# Patient Record
Sex: Female | Born: 1985 | Race: White | Hispanic: No | Marital: Single | Smoking: Never smoker
Health system: Southern US, Community
[De-identification: ages and names within clinical notes are randomized; demographics above are authoritative.]

## PROBLEM LIST (undated history)

## (undated) DIAGNOSIS — M25569 Pain in unspecified knee: Secondary | ICD-10-CM

## (undated) DIAGNOSIS — J45990 Exercise induced bronchospasm: Secondary | ICD-10-CM

## (undated) DIAGNOSIS — Z8719 Personal history of other diseases of the digestive system: Secondary | ICD-10-CM

## (undated) DIAGNOSIS — E282 Polycystic ovarian syndrome: Secondary | ICD-10-CM

## (undated) DIAGNOSIS — E88819 Insulin resistance, unspecified: Secondary | ICD-10-CM

## (undated) DIAGNOSIS — K760 Fatty (change of) liver, not elsewhere classified: Secondary | ICD-10-CM

## (undated) DIAGNOSIS — E8881 Metabolic syndrome: Secondary | ICD-10-CM

## (undated) HISTORY — DX: Pain in unspecified knee: M25.569

## (undated) HISTORY — PX: TYMPANOSTOMY TUBE PLACEMENT: SHX32

## (undated) HISTORY — DX: Metabolic syndrome: E88.81

## (undated) HISTORY — DX: Polycystic ovarian syndrome: E28.2

## (undated) HISTORY — DX: Personal history of other diseases of the digestive system: Z87.19

## (undated) HISTORY — DX: Exercise induced bronchospasm: J45.990

## (undated) HISTORY — DX: Fatty (change of) liver, not elsewhere classified: K76.0

## (undated) HISTORY — PX: TONSILLECTOMY: SUR1361

## (undated) HISTORY — DX: Insulin resistance, unspecified: E88.819

---

## 2010-01-14 ENCOUNTER — Encounter: Admission: RE | Admit: 2010-01-14 | Discharge: 2010-03-05 | Payer: Self-pay | Admitting: Family Medicine

## 2010-04-01 ENCOUNTER — Ambulatory Visit (HOSPITAL_COMMUNITY): Admission: RE | Admit: 2010-04-01 | Discharge: 2010-04-01 | Payer: Self-pay | Admitting: General Surgery

## 2010-04-13 ENCOUNTER — Ambulatory Visit (HOSPITAL_COMMUNITY): Admission: RE | Admit: 2010-04-13 | Discharge: 2010-04-13 | Payer: Self-pay | Admitting: General Surgery

## 2010-05-27 ENCOUNTER — Encounter
Admission: RE | Admit: 2010-05-27 | Discharge: 2010-08-04 | Payer: Self-pay | Source: Home / Self Care | Attending: General Surgery | Admitting: General Surgery

## 2010-06-09 ENCOUNTER — Ambulatory Visit (HOSPITAL_COMMUNITY)
Admission: RE | Admit: 2010-06-09 | Discharge: 2010-06-09 | Payer: Self-pay | Source: Home / Self Care | Admitting: General Surgery

## 2010-09-15 LAB — COMPREHENSIVE METABOLIC PANEL
ALT: 148 U/L — ABNORMAL HIGH (ref 0–35)
AST: 190 U/L — ABNORMAL HIGH (ref 0–37)
Albumin: 4.1 g/dL (ref 3.5–5.2)
Calcium: 9.6 mg/dL (ref 8.4–10.5)
GFR calc Af Amer: >60 mL/min (ref >60)
Glucose, Bld: 102 mg/dL — ABNORMAL HIGH (ref 70–99)
Sodium: 138 mEq/L (ref 135–145)
Total Protein: 7.7 g/dL (ref 6.0–8.3)

## 2010-09-15 LAB — PREGNANCY, URINE: Preg Test, Ur: NEGATIVE

## 2010-09-15 LAB — CBC
MCHC: 34.8 g/dL (ref 30.0–36.0)
Platelets: 200 10*3/uL (ref 150–400)
RDW: 13.6 % (ref 11.5–15.5)
WBC: 7.1 10*3/uL (ref 4.0–10.5)

## 2010-09-15 LAB — DIFFERENTIAL
Eosinophils Absolute: 0.2 10*3/uL (ref 0.0–0.7)
Lymphs Abs: 2.3 10*3/uL (ref 0.7–4.0)
Monocytes Absolute: 0.6 10*3/uL (ref 0.1–1.0)
Monocytes Relative: 9 % (ref 3–12)
Neutrophils Relative %: 55 % (ref 43–77)

## 2010-09-15 LAB — SURGICAL PCR SCREEN: MRSA, PCR: NEGATIVE

## 2010-09-22 ENCOUNTER — Ambulatory Visit: Payer: Self-pay | Admitting: *Deleted

## 2010-10-01 ENCOUNTER — Encounter: Payer: BC Managed Care – PPO | Attending: General Surgery | Admitting: *Deleted

## 2010-10-01 ENCOUNTER — Encounter: Payer: BC Managed Care – PPO | Admitting: *Deleted

## 2010-10-01 DIAGNOSIS — Z9884 Bariatric surgery status: Secondary | ICD-10-CM | POA: Insufficient documentation

## 2010-10-01 DIAGNOSIS — Z09 Encounter for follow-up examination after completed treatment for conditions other than malignant neoplasm: Secondary | ICD-10-CM | POA: Insufficient documentation

## 2010-10-01 DIAGNOSIS — Z713 Dietary counseling and surveillance: Secondary | ICD-10-CM | POA: Insufficient documentation

## 2010-10-15 ENCOUNTER — Ambulatory Visit: Payer: BC Managed Care – PPO | Admitting: *Deleted

## 2010-12-15 ENCOUNTER — Encounter (INDEPENDENT_AMBULATORY_CARE_PROVIDER_SITE_OTHER): Payer: Self-pay | Admitting: General Surgery

## 2010-12-28 ENCOUNTER — Ambulatory Visit: Payer: BC Managed Care – PPO | Admitting: *Deleted

## 2011-01-21 ENCOUNTER — Encounter: Payer: Self-pay | Admitting: *Deleted

## 2011-01-21 ENCOUNTER — Encounter: Payer: BC Managed Care – PPO | Attending: General Surgery | Admitting: *Deleted

## 2011-01-21 VITALS — Ht 70.0 in | Wt 239.4 lb

## 2011-01-21 DIAGNOSIS — Z713 Dietary counseling and surveillance: Secondary | ICD-10-CM | POA: Insufficient documentation

## 2011-01-21 DIAGNOSIS — Z09 Encounter for follow-up examination after completed treatment for conditions other than malignant neoplasm: Secondary | ICD-10-CM | POA: Insufficient documentation

## 2011-01-21 DIAGNOSIS — Z9884 Bariatric surgery status: Secondary | ICD-10-CM | POA: Insufficient documentation

## 2011-01-21 NOTE — Progress Notes (Signed)
  Follow-up visit: 7 Months Post-Operative LAGB Surgery  Medical Nutrition Therapy:  Appt start time: 1230 end time:  1300.  Assessment:  Primary concerns today: post-operative bariatric surgery nutrition management.  Weight today: 239.2 lbs Weight change: 8.2 lbs Total weight lost: 59 lbs total BMI: 34.4% Weight goal: 180 lbs % Weight goal met: 50%  Dietary intake: Pt unable to give food recall at time of visit. She reports making several "bad choices" and notes that "I have been eating out too much". Pt is not following a meal schedule with school being out and feels that once she begins her routine again she will be able to "jump start my weight loss". She struggles with making low-carb choices but also mentions that starchy foods "swell" in her stomach and cause n/v. AT notes that she can eat about 1 to 1 1/2 cups of food at one time.  Fluid intake: water, sugar free beverages = 32-48 oz Estimated total protein intake: 60-75g  Medications: Pt reports no changes to meds Supplementation: 75% compliance with ASBMS recommendations  Using straws: No Drinking while eating: No Hair loss: No Carbonated beverages: No N/V/D/C: Occasionally related to poor chewing, dry food (2-3 times/week) *Pt feels like her band is tight but she notes that she eats too fast, occasionally eats a lot of carb, and forgets to chew well  Last Lap-Band fill: No recent fills this past month. To see Mardelle Matte tomorrow.  Recent physical activity:  Pt reports decreased activity levels due to being on vacation. She reports playing some tournament sports and plans to increase her exercise routine. Pt notes an ankle injury >1 month ago which has "set me back".  Progress Towards Goal(s):  In progress.   Nutritional Diagnosis:  -3.3 Overweight/obesity As related to recent LAGB surgery.  As evidenced by pt continuing to follow LAGB dietary guidelines.    Intervention:    Follow Phase 3B: High Protein + Non-Starchy  Vegetables  Eat 3-6 small meals/snacks, every 3-5 hrs  Increase lean protein foods to meet 80-100g goal  Increase fluid intake to 64oz +  Add 15 grams of carbohydrate (fruit, whole grain, starchy vegetable) with meals  Avoid drinking 15 minutes before, during and 30 minutes after eating  Aim for >30 min of physical activity daily  Monitoring/Evaluation:  Dietary intake, exercise, lap band fills, and body weight. Follow up in 3 months for 10 month post-op visit.

## 2011-01-21 NOTE — Progress Notes (Signed)
Pt to see Dr. Andrey Campanile for band fill tomorrow. Note pt will be leaving next week for a week long trip to [place], [place] where she will be participating in a women's football championship.

## 2011-01-21 NOTE — Patient Instructions (Signed)
Goals:  Follow Phase 3B: High Protein + Non-Starchy Vegetables  Eat 3-6 small meals/snacks, every 3-5 hrs  Increase lean protein foods to meet 80-100g goal  Increase fluid intake to 64oz +  Add 15 grams of carbohydrate (fruit, whole grain, starchy vegetable) with meals  Avoid drinking 15 minutes before, during and 30 minutes after eating  Aim for >30 min of physical activity daily 

## 2011-01-22 ENCOUNTER — Encounter (INDEPENDENT_AMBULATORY_CARE_PROVIDER_SITE_OTHER): Payer: Self-pay | Admitting: General Surgery

## 2011-01-22 ENCOUNTER — Ambulatory Visit (INDEPENDENT_AMBULATORY_CARE_PROVIDER_SITE_OTHER): Payer: BC Managed Care – PPO | Admitting: General Surgery

## 2011-01-22 VITALS — BP 112/78 | Ht 70.0 in | Wt 239.8 lb

## 2011-01-22 DIAGNOSIS — Z9884 Bariatric surgery status: Secondary | ICD-10-CM

## 2011-01-22 NOTE — Progress Notes (Signed)
Chief complaint: LapBand fill  Procedure: Status post laparoscopic adjustable gastric band placement June 09, 2000  History of Present Ilness: [name] comes in today for long-term followup for laparoscopic adjustable gastric band placement. I last saw her on December 16, 2010. Since she was last seen she states that she's been doing fairly well. She denies any abdominal pain, nausea, vomiting, diarrhea or constipation. She states that she has not been exercising like she should be. She also states that she's been making some poor food choices. She's had a couple episodes of regurgitation. She attributes that to eating very quickly or or eating foods that she knows she should avoid.  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 She denies any reflux.  Past medical history-reviewed and unchanged  Past surgical history-reviewed and unchanged  Allergies-reviewed and unchanged  Current Outpatient Prescriptions  Medication Sig Dispense Refill  . Aromatic Inhalants (VAPOR INHALER IN) Inhale into the lungs as needed.        . Biotin 1000 MCG tablet Take 1,000 mcg by mouth daily.        . montelukast (SINGULAIR) 10 MG tablet Take 10 mg by mouth at bedtime.        . Multiple Vitamin (MULTIVITAMIN PO) Take by mouth daily.          Social history-reviewed and unchanged  Family history-reviewed and unchanged  Physical Exam: BP 112/78  Ht 5\' 10"  (1.778 m)  Wt 239 lb 12.8 oz (108.773 kg)  BMI 34.41 kg/m2  See LapBand flowsheet  Well-developed well-nourished obese Caucasian female in no apparent distress Pulmonary-lungs are clear auscultation Cardiac-regular rate and rhythm Abdomen-soft, nontender, nondistended. Well-healed trocar incisions. The port is in the right upper quadrant. No hernia Extremities-no cellulitis or edema Psychiatric-alert and oriented  Data reviewed: I reviewed my office note from December 16, 2010. I also reviewed the nutritionist letter.  Assessment and Plan: Status post laparoscopic adjustable gastric band placement. Her weight loss has plateaued since her last visit. I believe the majority of the problem his lack of exercise and poor food choices. We spent a fair amount of time discussing this. I recommended that she try to increase her activity level as well as improving her food choices. Normally I would add some fluid to her band; however, since she will be leaving the state early next week I am reluctant to do an adjustment today. We discussed stratergies that might make her make better food choices. I recommended that she take some protein shakes for the 18 hour car trip to [place] so that she would be less tempted to make poor food choices. I will see her in 4 weeks.

## 2011-01-22 NOTE — Patient Instructions (Signed)
Resume regular daily exercise.  Take your multivitamin & calcium supplement.  Make good food choices

## 2011-02-25 ENCOUNTER — Encounter (INDEPENDENT_AMBULATORY_CARE_PROVIDER_SITE_OTHER): Payer: BC Managed Care – PPO | Admitting: General Surgery

## 2011-03-23 ENCOUNTER — Ambulatory Visit (INDEPENDENT_AMBULATORY_CARE_PROVIDER_SITE_OTHER): Payer: BC Managed Care – PPO | Admitting: General Surgery

## 2011-03-23 VITALS — BP 118/82 | HR 66 | Resp 18 | Ht 70.0 in | Wt 245.2 lb

## 2011-03-23 DIAGNOSIS — Z4651 Encounter for fitting and adjustment of gastric lap band: Secondary | ICD-10-CM

## 2011-03-26 ENCOUNTER — Encounter (INDEPENDENT_AMBULATORY_CARE_PROVIDER_SITE_OTHER): Payer: Self-pay | Admitting: General Surgery

## 2011-03-26 NOTE — Progress Notes (Signed)
Chief complaint: LapBand fill  Procedure: Status post laparoscopic adjustable gastric band placement June 09, 2000  History of Present Ilness: [name] comes in today for long-term followup for laparoscopic adjustable gastric band placement. I last saw her in August. Since she was last seen she states that she's been doing fairly well. She denies any abdominal pain, nausea, vomiting, diarrhea or constipation. She states that she has not been exercising like she should be. She also states that she's been making some poor food choices. She's had a couple episodes of regurgitation. She attributes that to eating very quickly or or eating foods that she knows she should avoid. She has been out of town a lot.  She went to see her family up [place]. Her football team also traveled to [place] for the championship game & won.  She denies any reflux.  Past medical history-reviewed and unchanged  Past surgical history-reviewed and unchanged  Allergies-reviewed and unchanged  Current Outpatient Prescriptions  Medication Sig Dispense Refill  . Aromatic Inhalants (VAPOR INHALER IN) Inhale into the lungs as needed.        . Biotin 1000 MCG tablet Take 1,000 mcg by mouth daily.        . montelukast (SINGULAIR) 10 MG tablet Take 10 mg by mouth at bedtime.        . Multiple Vitamin (MULTIVITAMIN PO) Take by mouth daily.          Social history-reviewed and unchanged  Family history-reviewed and unchanged  Physical Exam: BP 118/82  Pulse 66  Resp 18  Ht 5\' 10"  (1.778 m)  Wt 245 lb 4 oz (111.245 kg)  BMI 35.19 kg/m2  5.6 lb weight gain since last visit; total wt loss since surgery 51.8 lbs  See LapBand flowsheet  Well-developed well-nourished obese Caucasian female in no apparent distress Pulmonary-lungs are clear auscultation Cardiac-regular rate and rhythm Abdomen-soft, nontender, nondistended. Well-healed trocar incisions. The port is in the right upper quadrant. No  hernia Extremities-no cellulitis or edema Psychiatric-alert and oriented  Data reviewed: I reviewed my office note from August, 2012. I also reviewed the nutritionist letter.  Assessment and Plan: Status post laparoscopic adjustable gastric band placement.  Despite her having several episodes of regurgitation, I have recommended an adjustment today. I believe the episodes of regurgitation she had occurred when she ate too rapidly and did not chew thoroughly. Her abdominal wall was prepped with ChloraPrep. I then aspirated her lap band port with a 22-gauge Huber needle and injected 0.25 cc of saline. She was able to tolerate water.  She was instructed to take a full liquid diet for the next 48 hours. I also encouraged her to increase her exercise activity. I will see her in 4 weeks.

## 2011-04-26 ENCOUNTER — Ambulatory Visit: Payer: BC Managed Care – PPO | Admitting: *Deleted

## 2011-08-19 ENCOUNTER — Telehealth (INDEPENDENT_AMBULATORY_CARE_PROVIDER_SITE_OTHER): Payer: Self-pay | Admitting: General Surgery

## 2011-08-19 NOTE — Telephone Encounter (Signed)
Left message for patient to call back and ask for me or just make an appt for follow up with Dr Andrey Campanile or the lap band clinic.

## 2012-02-02 IMAGING — CR DG UGI W/ KUB
2 series · 2 of 2 positions shown · non-contrast
Comparison: No similar prior study is available for comparison.

CLINICAL DATA: Morbid obesity, pre - bariatric surgery screening
exam.

UPPER GI SERIES WITH KUB
TECHNIQUE: Routine upper GI series was performed with thin barium
single contrast technique.
Fluoroscopy Time: 1.3 minutes

[view not recorded (1 of 2)]
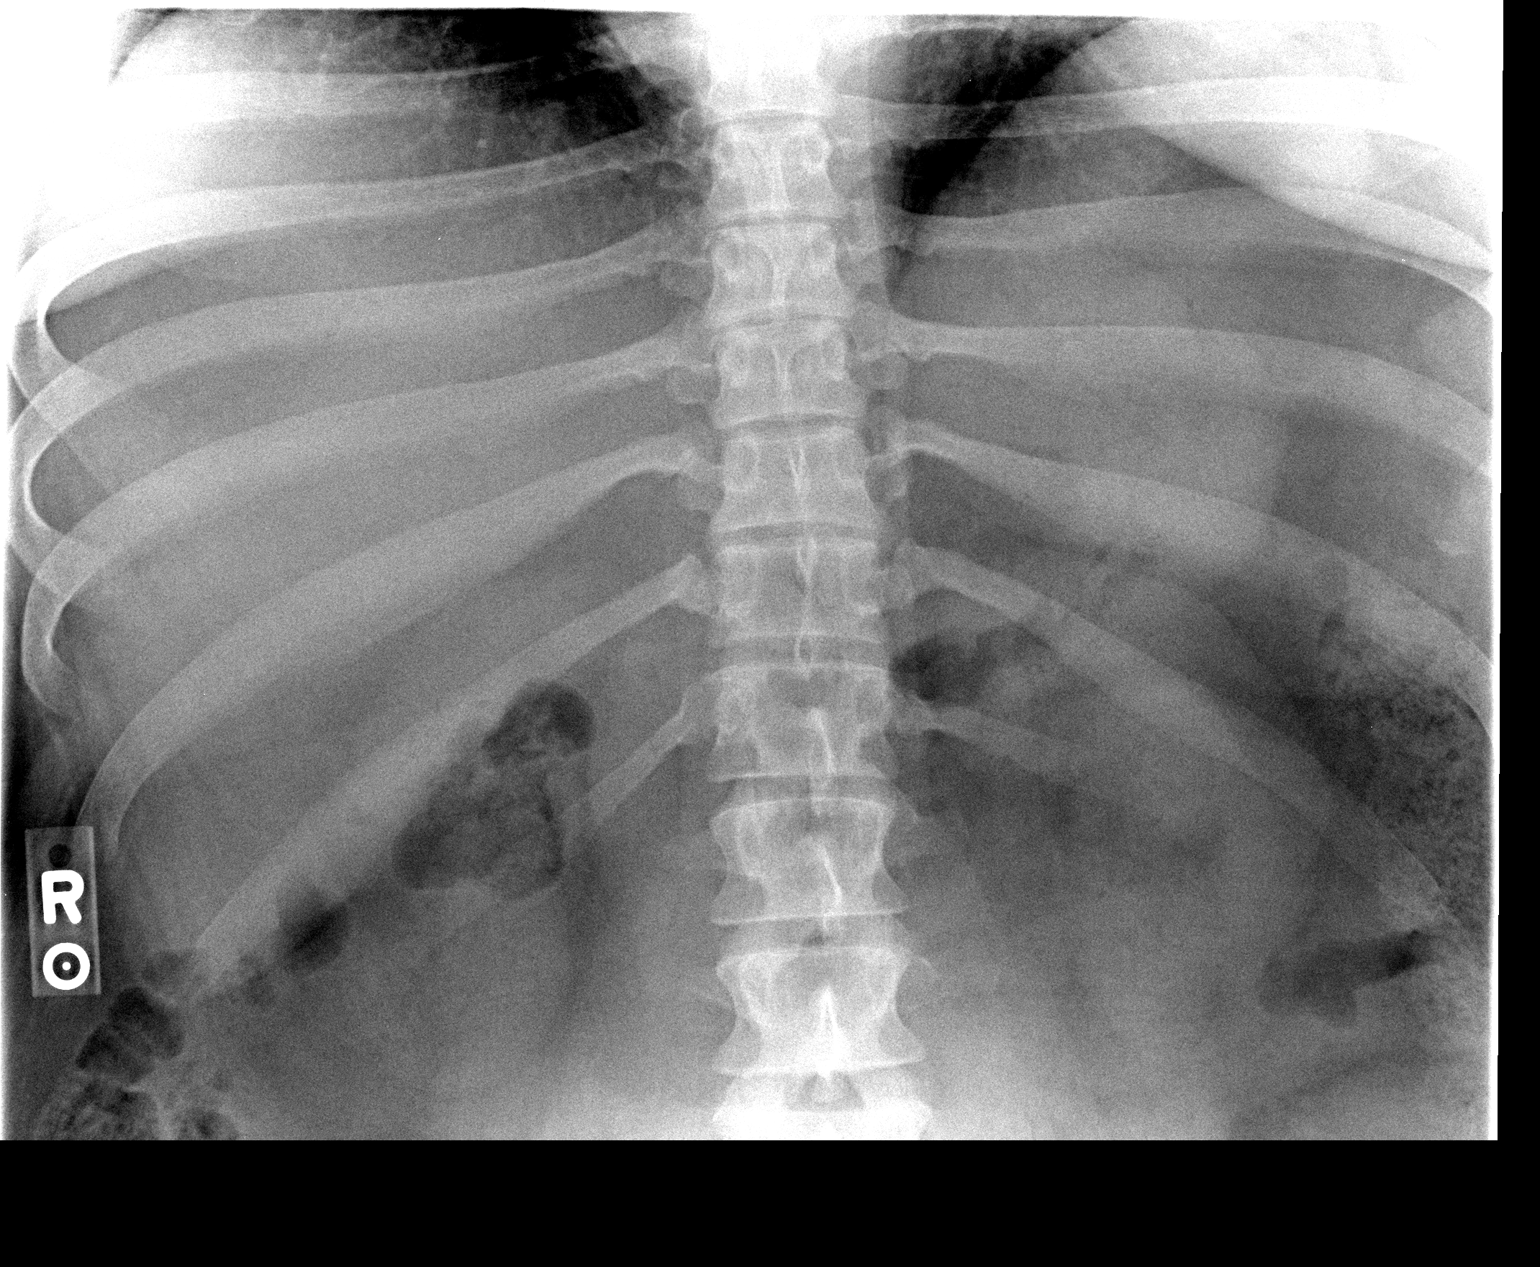

[view not recorded (2 of 2)]
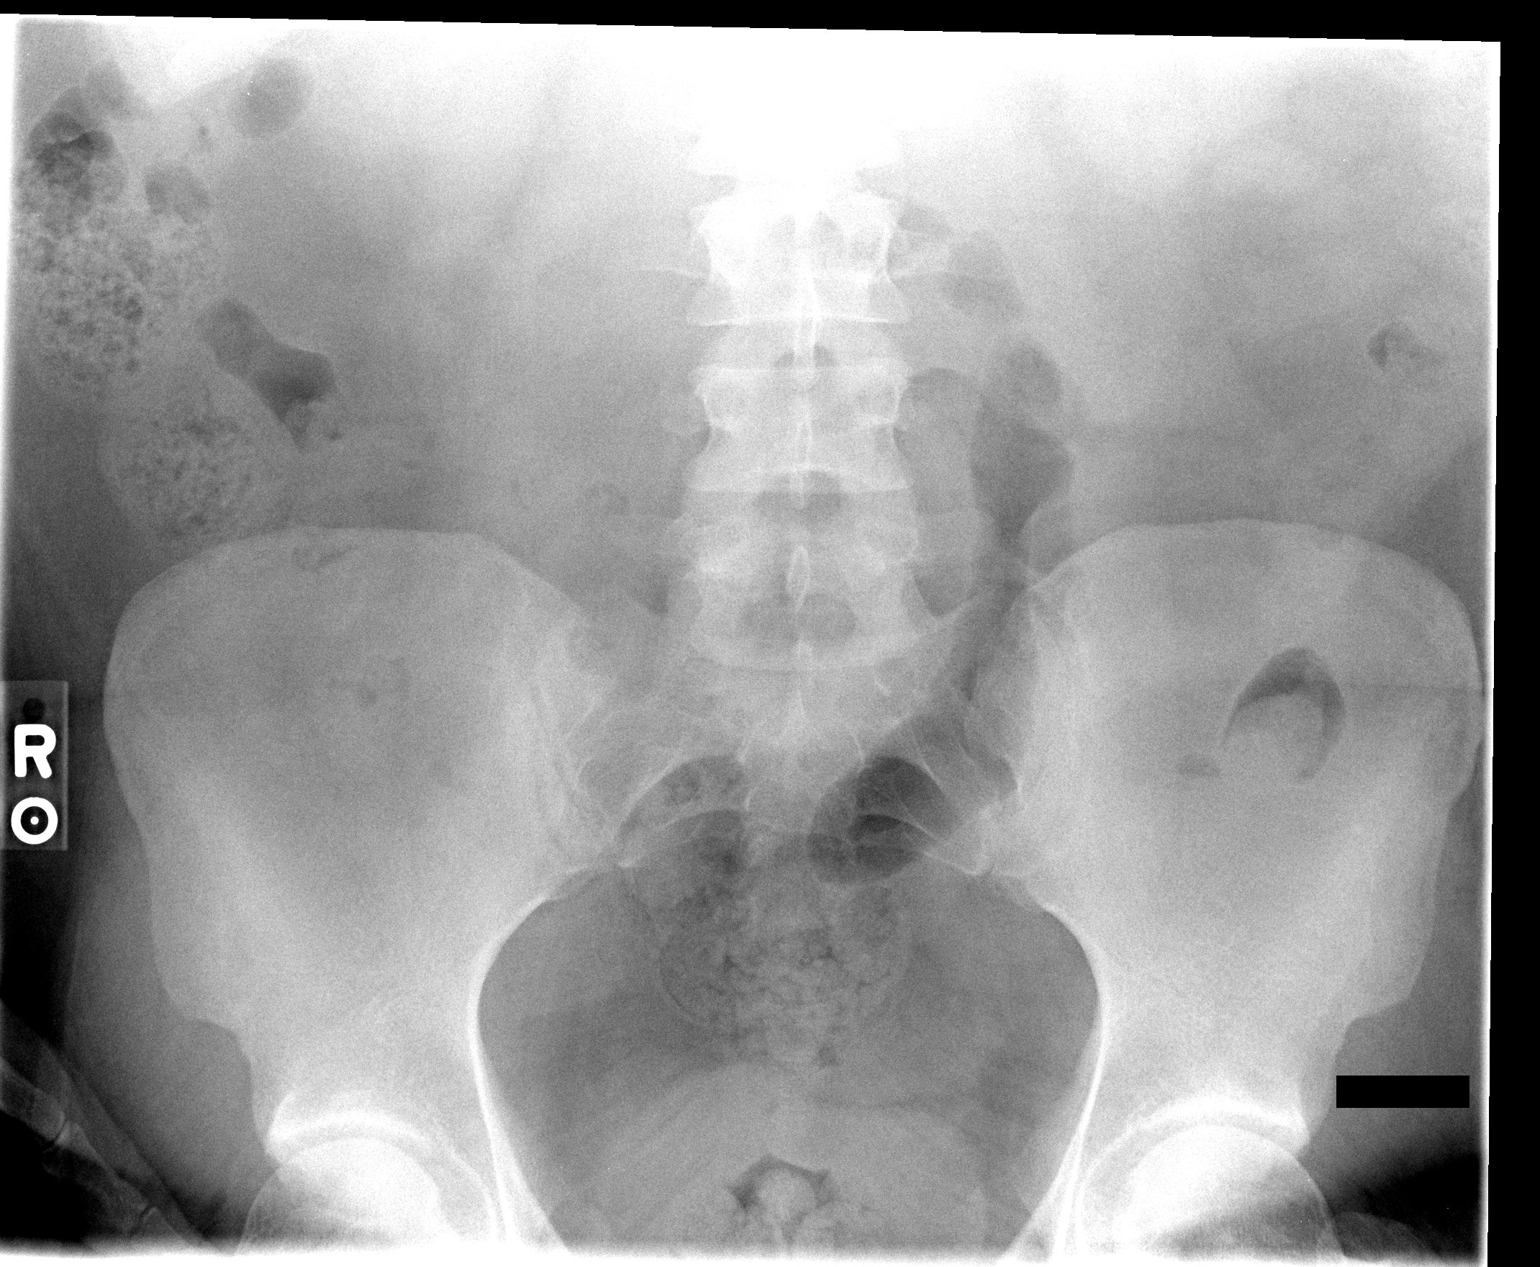

[2 of 2 positions shown; findings below may reference images not displayed]

FINDINGS: The esophagus is normal in course and caliber.  No
extrinsic compression or intrinsic filling defect is seen.  The
stomach is normal in contour given single contrast technique.
Ligament of Treitz is properly located.  No reflux was elicited.
IMPRESSION: Normal exam.

## 2013-10-31 ENCOUNTER — Telehealth (HOSPITAL_COMMUNITY): Payer: Self-pay

## 2013-10-31 NOTE — Telephone Encounter (Signed)
This patient is overdue for recommended follow-up with a bariatric surgeon at [facility]. Call attempted today to reestablish post-op care with [facility], but unable to reach patient by phone.  A letter will be mailed to the patient today to the address on file from [place] & [facility] advising the patient on the benefits of follow-up care and directing them to call [facility] at [phone] to schedule an appointment at their earliest convenience.  ° °Amanda T. Fleming °Bariatric Office Coordinator °[phone] ° °

## 2015-02-14 ENCOUNTER — Other Ambulatory Visit (HOSPITAL_COMMUNITY): Payer: Self-pay | Admitting: Surgery

## 2015-02-28 ENCOUNTER — Ambulatory Visit (HOSPITAL_COMMUNITY)
Admission: RE | Admit: 2015-02-28 | Discharge: 2015-02-28 | Disposition: A | Discharge status: Home / Self Care | Payer: BC Managed Care – PPO | Source: Ambulatory Visit | Attending: Surgery | Admitting: Surgery

## 2015-02-28 DIAGNOSIS — K449 Diaphragmatic hernia without obstruction or gangrene: Secondary | ICD-10-CM | POA: Diagnosis not present

## 2015-02-28 DIAGNOSIS — Z9884 Bariatric surgery status: Secondary | ICD-10-CM | POA: Diagnosis not present

## 2015-02-28 DIAGNOSIS — K8020 Calculus of gallbladder without cholecystitis without obstruction: Secondary | ICD-10-CM | POA: Diagnosis not present

## 2015-02-28 DIAGNOSIS — K760 Fatty (change of) liver, not elsewhere classified: Secondary | ICD-10-CM | POA: Insufficient documentation

## 2016-04-06 ENCOUNTER — Encounter (HOSPITAL_COMMUNITY): Payer: Self-pay

## 2017-01-12 ENCOUNTER — Encounter (HOSPITAL_COMMUNITY): Payer: Self-pay

## 2017-01-20 ENCOUNTER — Telehealth (HOSPITAL_COMMUNITY): Payer: Self-pay

## 2017-01-20 NOTE — Telephone Encounter (Signed)
This patient is overdue for recommended follow-up with a [facility]ariatric surgeon [facility]. A letter was mailed to the address on file 01/12/17 from both [place] & [facility] in attempt to reestablish post-op care. Letter has been returned to [employer] marked undeliverable listing a new suggested address. Mailing letter again today to listed address of [address], [place], [place] [id number]

## 2017-01-31 ENCOUNTER — Telehealth (HOSPITAL_COMMUNITY): Payer: Self-pay

## 2017-01-31 NOTE — Telephone Encounter (Signed)
This patient is overdue for recommended follow-up with a bariatric surgeon at [facility]. A letter was mailed to the address on file 01/12/17 from both [place] & [facility] in attempt to reestablish post-op care. The letter included a patient survey which was [facility].  Patient declined an appointment advising she has moved to [place] & transferred bariatric care to [facility]. New address noted in chart per direction of patient. Copy of the survey was shared with Dario GuardianFrances Jackson at [facility] so she may file in patients office chart. Info of transfer also shared with the [facility] for updating the national bariatric database.
